# Patient Record
Sex: Female | Born: 1995 | Race: White | Hispanic: No | Marital: Single | Smoking: Never smoker
Health system: Southern US, Community
[De-identification: ages and names within clinical notes are randomized; demographics above are authoritative.]

---

## 2020-03-27 ENCOUNTER — Other Ambulatory Visit: Payer: Self-pay

## 2020-03-27 ENCOUNTER — Emergency Department (HOSPITAL_COMMUNITY): Payer: No Typology Code available for payment source

## 2020-03-27 ENCOUNTER — Encounter (HOSPITAL_COMMUNITY): Payer: Self-pay | Admitting: Emergency Medicine

## 2020-03-27 ENCOUNTER — Emergency Department (HOSPITAL_COMMUNITY)
Admission: EM | Admit: 2020-03-27 | Discharge: 2020-03-27 | Disposition: A | Discharge status: Home / Self Care | Payer: No Typology Code available for payment source | Attending: Emergency Medicine | Admitting: Emergency Medicine

## 2020-03-27 DIAGNOSIS — S199XXA Unspecified injury of neck, initial encounter: Secondary | ICD-10-CM | POA: Diagnosis present

## 2020-03-27 DIAGNOSIS — S161XXA Strain of muscle, fascia and tendon at neck level, initial encounter: Secondary | ICD-10-CM

## 2020-03-27 DIAGNOSIS — S39012A Strain of muscle, fascia and tendon of lower back, initial encounter: Secondary | ICD-10-CM | POA: Insufficient documentation

## 2020-03-27 DIAGNOSIS — Y92410 Unspecified street and highway as the place of occurrence of the external cause: Secondary | ICD-10-CM | POA: Insufficient documentation

## 2020-03-27 LAB — POC URINE PREG, ED: Preg Test, Ur: NEGATIVE

## 2020-03-27 MED ORDER — NAPROXEN 375 MG PO TABS: ORAL_TABLET | ORAL | 0 refills | Status: AC

## 2020-03-27 MED ORDER — HYDROCODONE-ACETAMINOPHEN 5-325 MG PO TABS
1.0000 | ORAL_TABLET | Freq: Once | ORAL | Status: AC
  Administered 2020-03-27: 1 via ORAL
  Filled 2020-03-27: qty 1

## 2020-03-27 MED ORDER — CYCLOBENZAPRINE HCL 10 MG PO TABS: 10.0000 mg | ORAL_TABLET | Freq: Three times a day (TID) | ORAL | 0 refills | Status: AC | PRN

## 2020-03-27 MED ORDER — NAPROXEN 500 MG PO TABS
500.0000 mg | ORAL_TABLET | Freq: Once | ORAL | Status: AC
  Administered 2020-03-27: 500 mg via ORAL
  Filled 2020-03-27: qty 1
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
## 2020-03-27 NOTE — ED Notes (Signed)
She is easily aroused, and is oriented x 4 with clear speech. She ambulates and capably dresses herself. We are awaiting the arrival of her mother, which she tells me should be imminent.
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
## 2024-06-02 ENCOUNTER — Other Ambulatory Visit (HOSPITAL_BASED_OUTPATIENT_CLINIC_OR_DEPARTMENT_OTHER): Payer: Self-pay
